# Patient Record
Sex: Female | Born: 1980 | Race: White | Hispanic: No | Marital: Married | Smoking: Current every day smoker
Health system: Southern US, Community
[De-identification: ages and names within clinical notes are randomized; demographics above are authoritative.]

## PROBLEM LIST (undated history)

## (undated) DIAGNOSIS — N760 Acute vaginitis: Principal | ICD-10-CM

## (undated) DIAGNOSIS — Z8669 Personal history of other diseases of the nervous system and sense organs: Secondary | ICD-10-CM

## (undated) DIAGNOSIS — B9689 Other specified bacterial agents as the cause of diseases classified elsewhere: Secondary | ICD-10-CM

## (undated) HISTORY — DX: Other specified bacterial agents as the cause of diseases classified elsewhere: N76.0

## (undated) HISTORY — DX: Other specified bacterial agents as the cause of diseases classified elsewhere: B96.89

## (undated) HISTORY — DX: Personal history of other diseases of the nervous system and sense organs: Z86.69

---

## 2012-01-17 ENCOUNTER — Encounter: Payer: Self-pay | Admitting: Obstetrics and Gynecology

## 2012-01-24 ENCOUNTER — Ambulatory Visit (INDEPENDENT_AMBULATORY_CARE_PROVIDER_SITE_OTHER): Payer: Self-pay | Admitting: Obstetrics and Gynecology

## 2012-01-24 ENCOUNTER — Encounter: Payer: Self-pay | Admitting: Obstetrics and Gynecology

## 2012-01-24 VITALS — BP 92/62 | HR 68 | Ht 68.0 in | Wt 148.0 lb

## 2012-01-24 DIAGNOSIS — A499 Bacterial infection, unspecified: Secondary | ICD-10-CM

## 2012-01-24 DIAGNOSIS — Z202 Contact with and (suspected) exposure to infections with a predominantly sexual mode of transmission: Secondary | ICD-10-CM

## 2012-01-24 DIAGNOSIS — N760 Acute vaginitis: Secondary | ICD-10-CM

## 2012-01-24 DIAGNOSIS — B9689 Other specified bacterial agents as the cause of diseases classified elsewhere: Secondary | ICD-10-CM

## 2012-01-24 DIAGNOSIS — Z9189 Other specified personal risk factors, not elsewhere classified: Secondary | ICD-10-CM

## 2012-01-24 LAB — POCT WET PREP (WET MOUNT): Clue Cells Wet Prep Whiff POC: POSITIVE

## 2012-01-24 MED ORDER — METRONIDAZOLE 500 MG PO TABS: 500.0000 mg | ORAL_TABLET | Freq: Two times a day (BID) | ORAL | Status: AC

## 2012-01-24 NOTE — Progress Notes (Signed)
31 YO presents with  the need for std testing because she says that her husband has been unfaithful. Denies pelvic pain, uti symptoms or vaginal itching.  She does admit to increased vaginal discharge.   O: Pelvic:  EGBUS-wnl, vagina- large grey discharge, cervix-posterior/displaced right, uterus- normal size/shape    Wet Prep: pH 5.9, whiff +,  clue +   A: Bacterial Vagnosis  P: Perineal hygiene      GC/Ct Pending      Metronidazole 500 mg #14 bid x 7 days; Etoh precautions  Narada Uzzle, PA-C

## 2012-01-24 NOTE — Patient Instructions (Signed)
Avoid: - excess soap on genital area (consider using plain oatmeal soap) - use of powder or sprays in genital area - douching - wearing underwear to bed (except with menses) - using more than is directed detergent when washing clothes - tight fitting garments around genital area - excess sugar intake   

## 2012-01-25 LAB — GC/CHLAMYDIA PROBE AMP, GENITAL: Chlamydia, DNA Probe: NEGATIVE

## 2012-10-20 ENCOUNTER — Ambulatory Visit: Payer: Self-pay | Admitting: Obstetrics and Gynecology

## 2012-10-20 ENCOUNTER — Encounter: Payer: Self-pay | Admitting: Obstetrics and Gynecology

## 2012-10-20 VITALS — BP 100/62 | HR 70 | Ht 68.0 in | Wt 148.0 lb

## 2012-10-20 DIAGNOSIS — Z124 Encounter for screening for malignant neoplasm of cervix: Secondary | ICD-10-CM

## 2012-10-20 DIAGNOSIS — N390 Urinary tract infection, site not specified: Secondary | ICD-10-CM

## 2012-10-20 DIAGNOSIS — Z01419 Encounter for gynecological examination (general) (routine) without abnormal findings: Secondary | ICD-10-CM

## 2012-10-20 LAB — POCT URINALYSIS DIPSTICK
Blood, UA: NEGATIVE
Ketones, UA: NEGATIVE
Protein, UA: NEGATIVE
Spec Grav, UA: >=1.03

## 2012-10-20 NOTE — Progress Notes (Signed)
Subjective:    Dawn Herman is a 32 y.o. female, G1P0010, who presents for an annual exam. The patient reports urinary frequency and wants to be tested for GC/CT. Reports a coworker had to be hospitalized for a kidney infection and she's concerned that the same may happen to her.  Patient desires pregnancy but states that prenatal vitamins caused her throat to swell and has never taken regular multivitamins.  Reviewed the need for folic acid.  Menstrual cycle:   LMP: Patient's last menstrual period was 10/10/2012.             Review of Systems Pertinent items are noted in HPI. Denies pelvic pain, urinary tract symptoms, vaginitis symptoms, irregular bleeding, menopausal symptoms, change in bowel habits or rectal bleeding   Objective:    BP 100/62  Pulse 70  Ht 5\' 8"  (1.727 m)  Wt 148 lb (67.132 kg)  BMI 22.51 kg/m2  LMP 10/10/2012    Wt Readings from Last 1 Encounters:  10/20/12 148 lb (67.132 kg)   Body mass index is 22.51 kg/(m^2). General Appearance: Alert, no acute distress HEENT: Grossly normal Neck / Thyroid: Supple, no thyromegaly or cervical adenopathy Lungs: Clear to auscultation bilaterally Back: No CVA tenderness Breast Exam: No masses or nodes.No dimpling, nipple retraction or discharge. Cardiovascular: Regular rate and rhythm.  Gastrointestinal: Soft, non-tender, no masses or organomegaly Pelvic Exam: EGBUS-wnl, vagina-normal rugae, cervix- without lesions or tenderness, uterus appears normal size shape and consistency, adnexae-no masses or tenderness Lymphatic Exam: Non-palpable nodes in neck, clavicular,  axillary, or inguinal regions  Skin: no rashes or abnormalities Extremities: no clubbing cyanosis or edema  Neurologic: grossly normal Psychiatric: Alert and oriented  Urinalysis: SG: 1.030,  ph: 5.0 otherwise negative.   Assessment:   Routine GYN Exam Desire to Conceive (intolerant of prenatal vitamins)   Plan:    PAP sent  RTO 1 year or  prn  Kristell Wooding,ELMIRAPA-C

## 2012-10-20 NOTE — Progress Notes (Signed)
Regular Periods: yes Mammogram: no  Monthly Breast Ex.: yes Exercise: yes  Tetanus < 10 years: no Seatbelts: yes  NI. Bladder Functn.: yes Abuse at home: no  Daily BM's: no Stressful Work: yes  Healthy Diet: yes Sigmoid-Colonoscopy: no  Calcium: no Medical problems this year: want to check for uti   LAST PAP:6/11  Contraception: none  Mammogram:  no  PCP: no  PMH: no change  FMH: no change  Last Bone Scan: no  Pt is married.

## 2012-10-20 NOTE — Patient Instructions (Signed)
Take Folic Acid 400 mcg daily or more as you prepare to get pregnant

## 2012-10-24 LAB — PAP IG, CT-NG, RFX HPV ASCU
Chlamydia Probe Amp: NEGATIVE
GC Probe Amp: NEGATIVE

## 2012-10-30 ENCOUNTER — Telehealth: Payer: Self-pay | Admitting: Obstetrics and Gynecology

## 2012-10-30 NOTE — Telephone Encounter (Signed)
Spoke with pt informed labs wnl pt voice understanding 

## 2013-07-21 ENCOUNTER — Ambulatory Visit: Payer: 59 | Admitting: Emergency Medicine

## 2013-07-21 ENCOUNTER — Ambulatory Visit: Payer: 59

## 2013-07-21 VITALS — BP 110/60 | HR 92 | Temp 101.0°F | Resp 18 | Ht 68.0 in | Wt 143.6 lb

## 2013-07-21 DIAGNOSIS — R059 Cough, unspecified: Secondary | ICD-10-CM

## 2013-07-21 DIAGNOSIS — R509 Fever, unspecified: Secondary | ICD-10-CM

## 2013-07-21 DIAGNOSIS — R05 Cough: Secondary | ICD-10-CM

## 2013-07-21 DIAGNOSIS — J029 Acute pharyngitis, unspecified: Secondary | ICD-10-CM

## 2013-07-21 LAB — POCT CBC
Granulocyte percent: 83.9 %G — AB (ref 37–80)
HCT, POC: 44.8 % (ref 37.7–47.9)
Hemoglobin: 14.1 g/dL (ref 12.2–16.2)
Lymph, poc: 1.3 (ref 0.6–3.4)
MCH, POC: 30.4 pg (ref 27–31.2)
MCHC: 31.5 g/dL — AB (ref 31.8–35.4)
MCV: 96.5 fL (ref 80–97)
MID (cbc): 0.6 (ref 0–0.9)
MPV: 9.3 fL (ref 0–99.8)
POC Granulocyte: 9.8 — AB (ref 2–6.9)
POC LYMPH PERCENT: 11.3 %L (ref 10–50)
POC MID %: 4.8 %M (ref 0–12)
Platelet Count, POC: 170 10*3/uL (ref 142–424)
RBC: 4.64 M/uL (ref 4.04–5.48)
RDW, POC: 13.1 %
WBC: 11.7 10*3/uL — AB (ref 4.6–10.2)

## 2013-07-21 LAB — POCT RAPID STREP A (OFFICE): Rapid Strep A Screen: NEGATIVE

## 2013-07-21 LAB — POCT INFLUENZA A/B
Influenza A, POC: NEGATIVE
Influenza B, POC: NEGATIVE

## 2013-07-21 MED ORDER — AMOXICILLIN 875 MG PO TABS: 875.0000 mg | ORAL_TABLET | Freq: Two times a day (BID) | ORAL | Status: DC

## 2013-07-21 NOTE — Progress Notes (Signed)
Patient ID: Dawn Herman MRN: [id number], DOB: 1981/01/25, 32 y.o. Date of Encounter: 07/21/2013, 5:28 PM  Primary Physician: Henreitta Leber, PA-C  Chief Complaint: fever, chills, nasal congestion  HPI: 32 y.o. year old female with presents with 1 day history of nasal congestion, fever, chills, and slight dry cough. Complains of chest pain while coughing and also complains of severe sore throat. Does have hx of strep throat. No known flu or strep contacts but she works at [employer] so has a lot of exposure to [employer]. Denies SOB, nausea, vomiting, or dizziness. Has taken ibuprofen which has helped some with the fever. Highest temp at home 102.  Symptoms started suddenly this morning.    Patient is otherwise doing well without issues or complaints.  Past Medical History  Diagnosis Date  . BV (bacterial vaginosis)     recurrent  . Hx of migraines      Home Meds: Prior to Admission medications   Not on File    Allergies:  Allergies  Allergen Reactions  . Tylenol [Acetaminophen] Other (See Comments)    migraines    History   Social History  . Marital Status: Married    Spouse Name: N/A    Number of Children: N/A  . Years of Education: N/A   Occupational History  . Not on file.   Social History Main Topics  . Smoking status: Current Every Day Smoker -- 15.00 packs/day    Types: Cigarettes  . Smokeless tobacco: Never Used     Comment: 15 cigarettes a day  . Alcohol Use: Yes     Comment: social  . Drug Use: Yes  . Sexual Activity: Yes    Partners: Male    Birth Control/ Protection: None   Other Topics Concern  . Not on file   Social History Narrative  . No narrative on file     Review of Systems: Constitutional: positive for chills and fever. Negative for night sweats, weight changes, or fatigue  HEENT: negative for vision changes, hearing loss. Positive for congestion, rhinorrhea, ST. No epistaxis, or sinus pressure Cardiovascular: negative for chest  pain or palpitations Respiratory: positive for cough. negative for hemoptysis, wheezing, shortness of breath. Abdominal: negative for abdominal pain, nausea, vomiting, diarrhea, or constipation Dermatological: negative for rashes. Neurologic: negative for headache, dizziness, or syncope   Physical Exam: Blood pressure 110/60, pulse 92, temperature 101 F (38.3 C), temperature source Oral, resp. rate 18, height 5\' 8"  (1.727 m), weight 143 lb 9.6 oz (65.137 kg), last menstrual period 07/13/2013, SpO2 98.00%., Body mass index is 21.84 kg/(m^2). General: Well developed, well nourished, in no acute distress. Head: Normocephalic, atraumatic, eyes without discharge, sclera non-icteric, nares are without discharge. Bilateral auditory canals clear, TM's are without perforation, pearly grey and translucent with reflective cone of light bilaterally. Oral cavity moist, posterior pharynx + exudate and erythema. No peritonsillar abscess, or post nasal drip.  Neck: Supple. No thyromegaly. Full ROM. No lymphadenopathy. Lungs: Clear bilaterally to auscultation without wheezes, rales, or rhonchi. Breathing is unlabored. Heart: RRR with S1 S2. No murmurs, rubs, or gallops appreciated. Msk:  Strength and tone normal for age. Extremities/Skin: Warm and dry. No clubbing or cyanosis. No edema.  Neuro: Alert and oriented X 3. Moves all extremities spontaneously. Gait is normal. CNII-XII grossly in tact. Psych:  Responds to questions appropriately with a normal affect.   Labs: Results for orders placed in visit on 07/21/13  POCT INFLUENZA A/B      Result Value Range  Influenza A, POC Negative     Influenza B, POC Negative    POCT RAPID STREP A (OFFICE)      Result Value Range   Rapid Strep A Screen Negative  Negative  POCT CBC      Result Value Range   WBC 11.7 (*) 4.6 - 10.2 K/uL   Lymph, poc 1.3  0.6 - 3.4   POC LYMPH PERCENT 11.3  10 - 50 %L   MID (cbc) 0.6  0 - 0.9   POC MID % 4.8  0 - 12 %M   POC  Granulocyte 9.8 (*) 2 - 6.9   Granulocyte percent 83.9 (*) 37 - 80 %G   RBC 4.64  4.04 - 5.48 M/uL   Hemoglobin 14.1  12.2 - 16.2 g/dL   HCT, POC 16.1  09.6 - 47.9 %   MCV 96.5  80 - 97 fL   MCH, POC 30.4  27 - 31.2 pg   MCHC 31.5 (*) 31.8 - 35.4 g/dL   RDW, POC 04.5     Platelet Count, POC 170  142 - 424 K/uL   MPV 9.3  0 - 99.8 fL       [facility] reading (PRIMARY) by  Dr. Cleta Alberts as no acute infiltrate or consolidation.    ASSESSMENT AND PLAN:  32 y.o. year old female with influenza-like illness and possible strep tonsillitis.  Increase fluids and rest Ibuprofen and tylenol in alternating doses q4hours Start amoxicillin 875 mg bid x 10 days. Discussed starting Tamiflu pros/cons - patient decided to hold on this Follow up if symptoms worsen or fail to improve.  Dawn Mitts, PA-C 07/21/2013 5:28 PM

## 2013-07-23 LAB — CULTURE, GROUP A STREP

## 2014-03-19 IMAGING — CR DG CHEST 2V
2 series · 2 of 2 positions shown · non-contrast
Comparison: None available for comparison at time of study
interpretation.

CLINICAL DATA: Cough.

EXAM:
CHEST  2 VIEW

[PA]
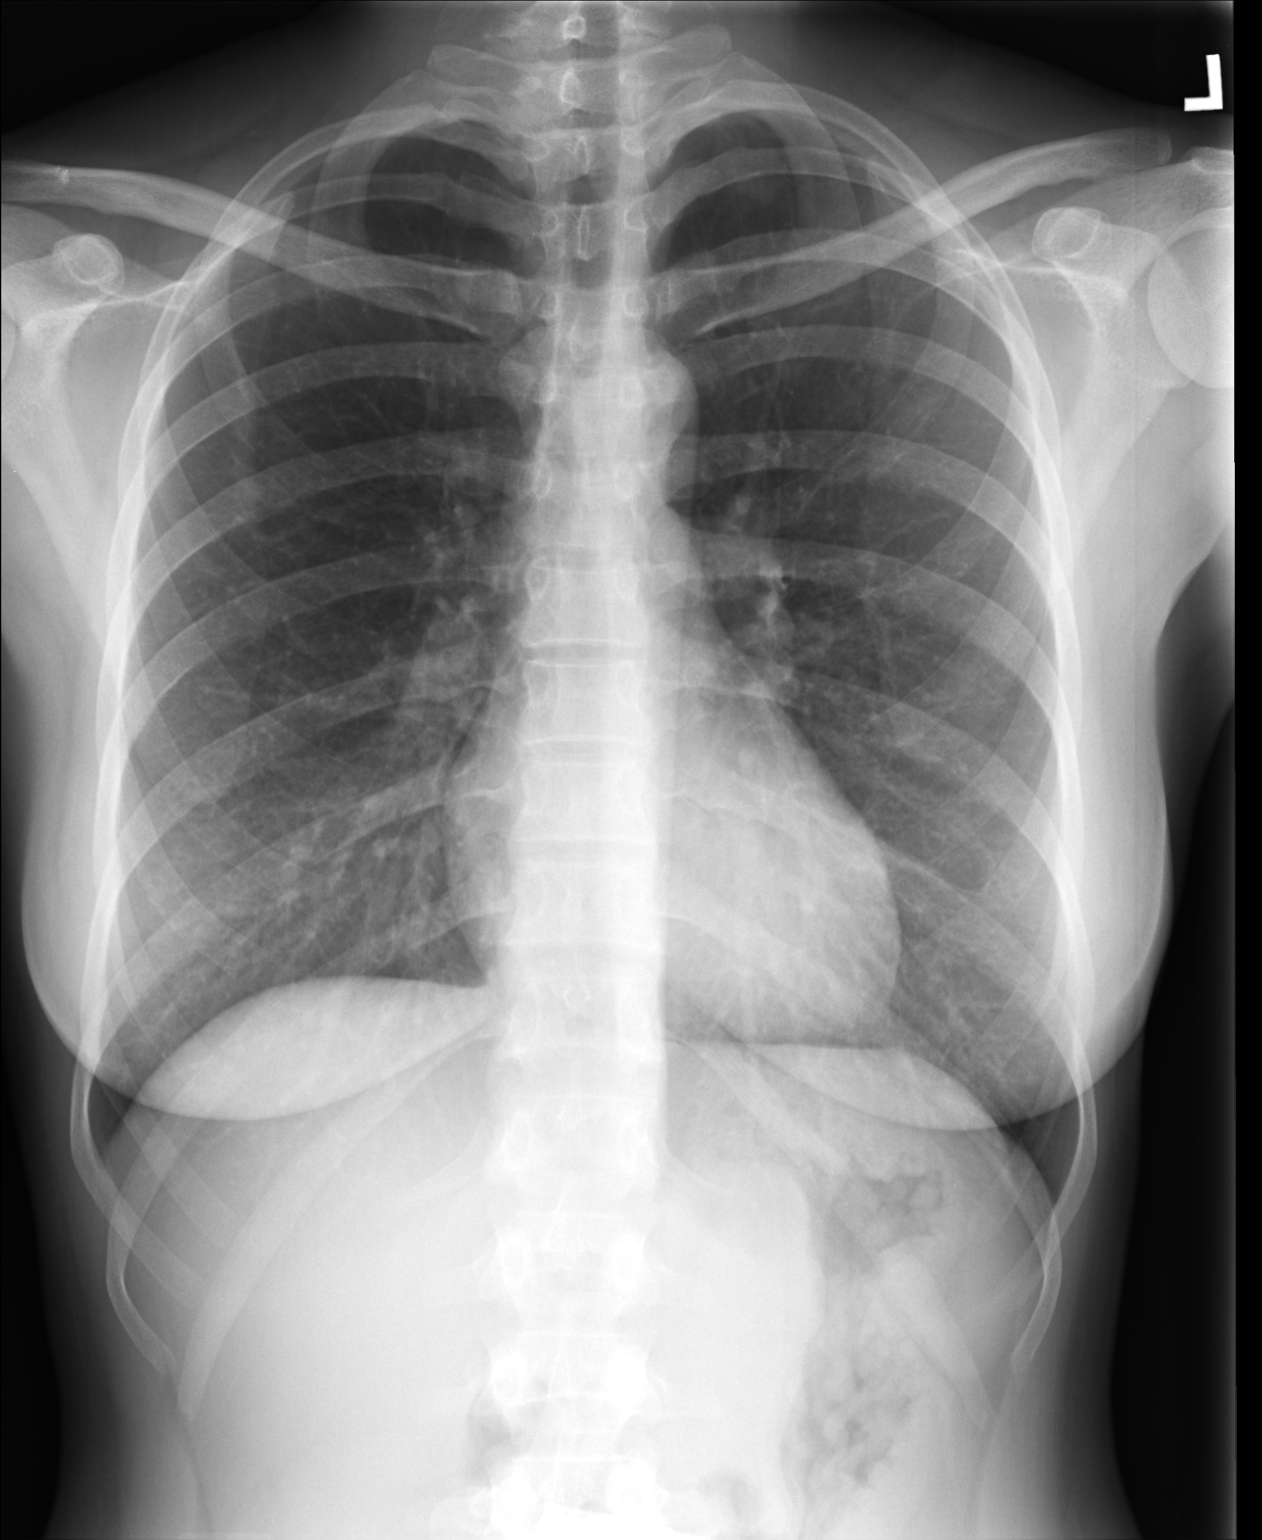

[lateral]
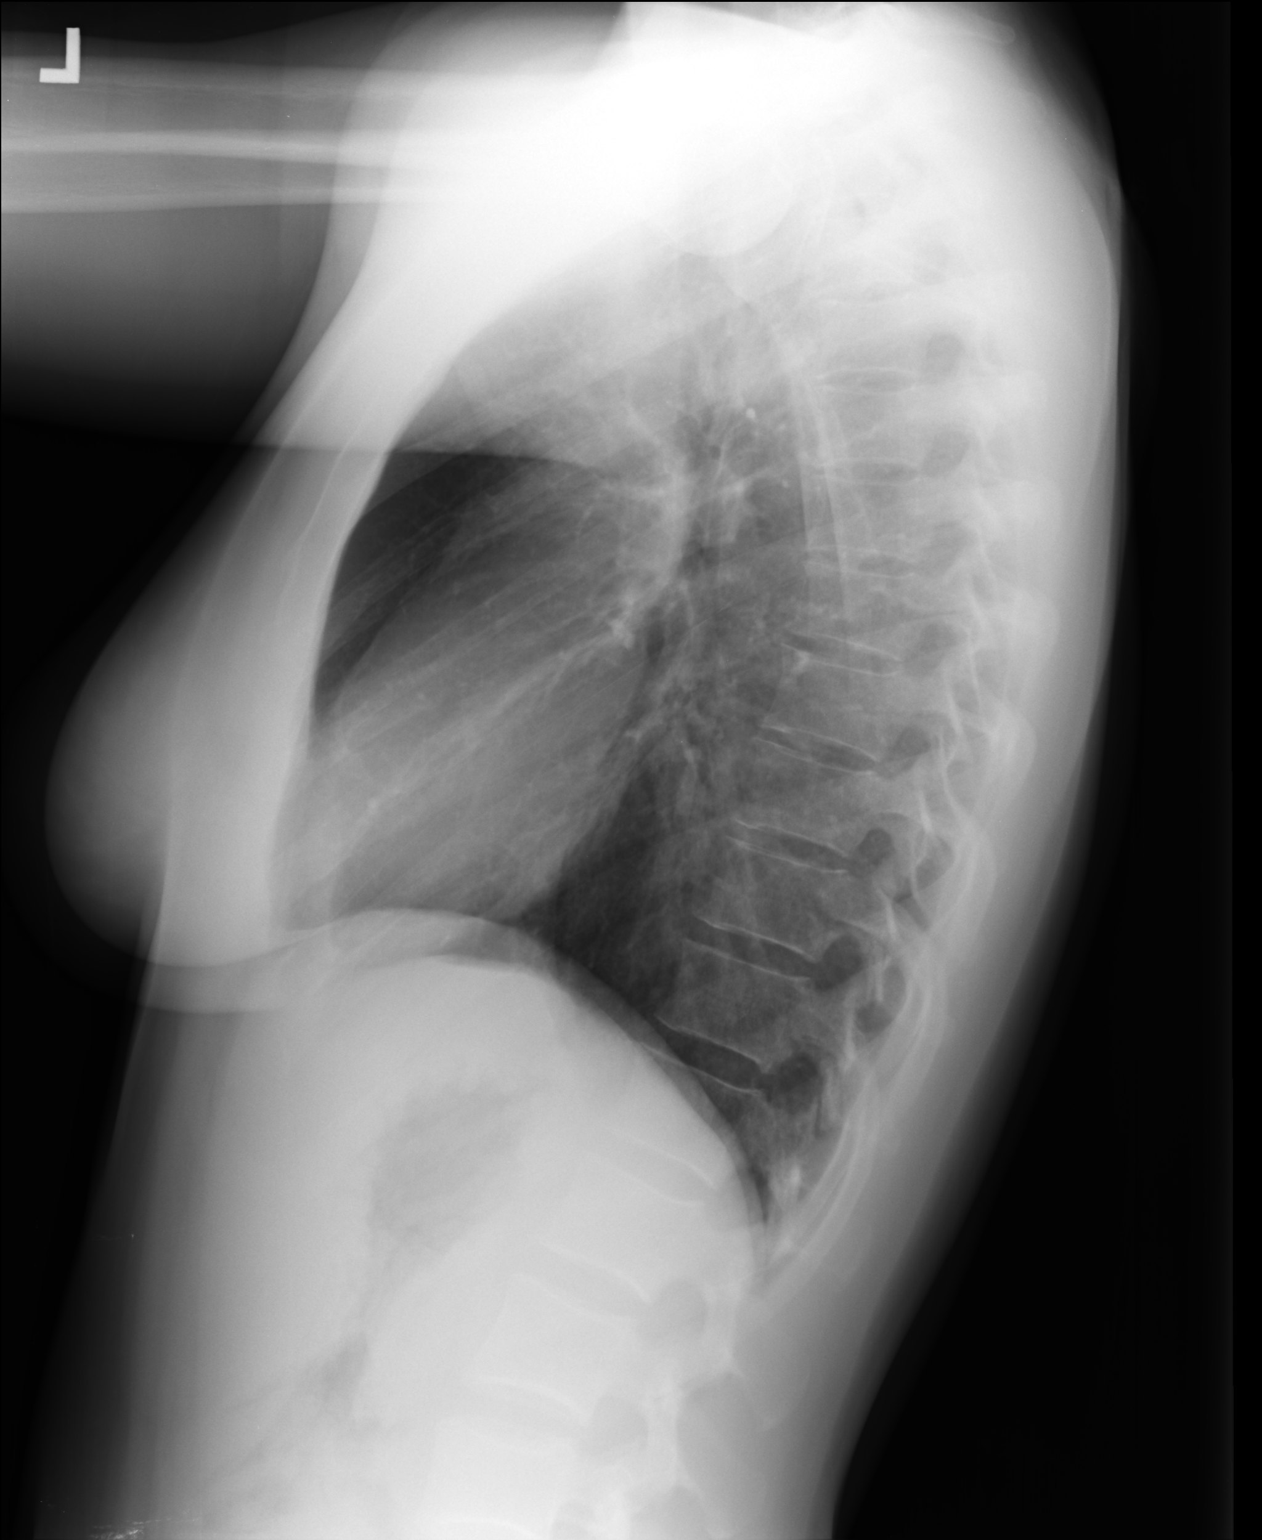

[2 of 2 positions shown; findings below may reference images not displayed]

FINDINGS: Cardiomediastinal silhouette is unremarkable. The lungs are clear
without pleural effusions or focal consolidations. Pulmonary
vasculature is unremarkable. Trachea projects midline and there is
no pneumothorax. Soft tissue planes and included osseous structures
are nonsuspicious.
IMPRESSION: No acute cardiopulmonary process.

  By: Thaibaipo Jamisola

## 2014-07-18 ENCOUNTER — Ambulatory Visit (INDEPENDENT_AMBULATORY_CARE_PROVIDER_SITE_OTHER): Payer: 59 | Admitting: Family Medicine

## 2014-07-18 VITALS — BP 102/68 | HR 78 | Temp 98.2°F | Resp 18 | Ht 69.0 in | Wt 128.0 lb

## 2014-07-18 DIAGNOSIS — J029 Acute pharyngitis, unspecified: Secondary | ICD-10-CM

## 2014-07-18 DIAGNOSIS — R509 Fever, unspecified: Secondary | ICD-10-CM

## 2014-07-18 LAB — POCT CBC
GRANULOCYTE PERCENT: 67.7 % (ref 37–80)
HCT, POC: 47 % (ref 37.7–47.9)
Hemoglobin: 15.5 g/dL (ref 12.2–16.2)
Lymph, poc: 2.6 (ref 0.6–3.4)
MCH: 30.8 pg (ref 27–31.2)
MCHC: 32.9 g/dL (ref 31.8–35.4)
MCV: 93.7 fL (ref 80–97)
MID (CBC): 0.5 (ref 0–0.9)
MPV: 7.9 fL (ref 0–99.8)
PLATELET COUNT, POC: 176 10*3/uL (ref 142–424)
POC Granulocyte: 6.5 (ref 2–6.9)
POC LYMPH %: 27.4 % (ref 10–50)
POC MID %: 4.9 %M (ref 0–12)
RBC: 5.01 M/uL (ref 4.04–5.48)
RDW, POC: 13.5 %
WBC: 9.6 10*3/uL (ref 4.6–10.2)

## 2014-07-18 LAB — POCT RAPID STREP A (OFFICE): Rapid Strep A Screen: NEGATIVE

## 2014-07-18 MED ORDER — AMOXICILLIN-POT CLAVULANATE 875-125 MG PO TABS: 1.0000 | ORAL_TABLET | Freq: Two times a day (BID) | ORAL | Status: DC

## 2014-07-18 NOTE — Progress Notes (Addendum)
[facility] [address], [place] [phone]  Date:  07/18/2014   Name:  Dawn ArntVeronica Yi   DOB:  Jun 29, 1981   MRN:  [id number]  PCP:  Patrick JupiterPOWELL,ELMIRA, PA-C    Chief Complaint: Fever; Night Sweats; Generalized Body Aches; and Sore Throat   History of Present Illness:  Dawn Herman is a 33 y.o. very pleasant female patient who presents with the following:  Here today with illnes for 2-3 days.  She was here last year around this time with strep throat.  She notes a ST, fever up to 102, tender LAD in her neck, fatigue and body aches.  No cough, no GI symptoms.  No runny nose or sneezing, no earache.  The ST is her main concern today. She is generally in good health.    LMP 10/28  Patient Active Problem List   Diagnosis Date Noted  . BV (bacterial vaginosis) 01/24/2012    Past Medical History  Diagnosis Date  . BV (bacterial vaginosis)     recurrent  . Hx of migraines     History reviewed. No pertinent past surgical history.  History  Substance Use Topics  . Smoking status: Current Every Day Smoker -- 15.00 packs/day    Types: Cigarettes  . Smokeless tobacco: Never Used     Comment: 15 cigarettes a day  . Alcohol Use: Yes     Comment: social    Family History  Problem Relation Age of Onset  . Hypertension Mother     Allergies  Allergen Reactions  . Tylenol [Acetaminophen] Other (See Comments)    migraines    Medication list has been reviewed and updated.  Current Outpatient Prescriptions on File Prior to Visit  Medication Sig Dispense Refill  . amoxicillin (AMOXIL) 875 MG tablet Take 1 tablet (875 mg total) by mouth 2 (two) times daily. 20 tablet 0   No current facility-administered medications on file prior to visit.    Review of Systems:  As per HPI- otherwise negative.   Physical Examination: Filed Vitals:   07/18/14 1343  BP: 102/68  Pulse: 78  Temp: 98.2 F (36.8 C)  Resp: 18   Filed Vitals:   07/18/14 1343  Height: 5\' 9"  (1.753 m)  Weight: 128 lb (58.06 kg)   Body mass index is 18.89 kg/(m^2). Ideal Body Weight: Weight in (lb) to have BMI = 25: 168.9  GEN: WDWN, NAD, Non-toxic, A & O x 3, slim, looks well HEENT: Atraumatic, Normocephalic. Neck supple. No masses, tender cervical LAD.  Bilateral TM wnl, oropharynx inflmaed with slight exudate. PEERL,EOMI.   Ears and Nose: No external deformity. CV: RRR, No M/G/R. No JVD. No thrill. No extra heart sounds. PULM: CTA B, no wheezes, crackles, rhonchi. No retractions. No resp. distress. No accessory muscle use. ABD: S, NT, ND EXTR: No c/c/e NEURO Normal gait.  PSYCH: Normally interactive. Conversant. Not depressed or anxious appearing.  Calm demeanor.    Results for orders placed or performed in visit on 07/18/14  POCT rapid strep A  Result Value Ref Range   Rapid Strep A Screen Negative Negative  POCT CBC  Result Value Ref Range   WBC 9.6 4.6 - 10.2 K/uL   Lymph, poc 2.6 0.6 - 3.4   POC LYMPH PERCENT 27.4 10 - 50 %L   MID (cbc) 0.5 0 - 0.9   POC MID % 4.9 0 - 12 %M   POC Granulocyte 6.5 2 - 6.9   Granulocyte percent  67.7 37 - 80 %G   RBC 5.01 4.04 - 5.48 M/uL   Hemoglobin 15.5 12.2 - 16.2 g/dL   HCT, POC 16.147.0 09.637.7 - 47.9 %   MCV 93.7 80 - 97 fL   MCH, POC 30.8 27 - 31.2 pg   MCHC 32.9 31.8 - 35.4 g/dL   RDW, POC 04.513.5 %   Platelet Count, POC 176 142 - 424 K/uL   MPV 7.9 0 - 99.8 fL    Assessment and Plan: Fever, unknown origin - Plan: POCT rapid strep A, Culture, Group A Strep, POCT CBC, amoxicillin-clavulanate (AUGMENTIN) 875-125 MG per tablet  Acute pharyngitis, unspecified pharyngitis type - Plan: POCT rapid strep A, Culture, Group A Strep, POCT CBC, amoxicillin-clavulanate (AUGMENTIN) 875-125 MG per tablet  Exudative pharyngitis with negative rapid strep.  Consider strep throat vs other bacterial infection.  Start tx with agumentin 875 BID for 10 days and await culture  Signed Abbe AmsterdamJessica Copland, MD  11/15:  received her negative tcx.  Called but no answer and no VM> will send letter

## 2014-07-18 NOTE — Patient Instructions (Signed)
Use the augmentin antibiotic as directed, and use ibuprofen as needed for fever. Rest and drink plenty of fluids Let me know if you are not better soon!

## 2014-07-21 ENCOUNTER — Encounter: Payer: Self-pay | Admitting: Family Medicine

## 2014-07-21 LAB — CULTURE, GROUP A STREP: Organism ID, Bacteria: NORMAL

## 2018-02-14 ENCOUNTER — Encounter (HOSPITAL_COMMUNITY): Payer: Self-pay | Admitting: *Deleted

## 2018-02-14 ENCOUNTER — Other Ambulatory Visit: Payer: Self-pay

## 2018-02-14 DIAGNOSIS — R51 Headache: Secondary | ICD-10-CM | POA: Insufficient documentation

## 2018-02-14 DIAGNOSIS — Z5321 Procedure and treatment not carried out due to patient leaving prior to being seen by health care provider: Secondary | ICD-10-CM | POA: Insufficient documentation

## 2018-02-14 NOTE — ED Triage Notes (Addendum)
Pt reports migraine h/a today.  Reports nausea resolved after taking 200mg  of motrin.  She states she received an injection for her migraine h/a about 5 years ago and has not had any h/a since.  Pt is A&Ox 4.  Ambulatory without difficulty.  Reports photosensitivity.

## 2018-02-15 ENCOUNTER — Emergency Department (HOSPITAL_COMMUNITY)
Admission: EM | Admit: 2018-02-15 | Discharge: 2018-02-15 | Disposition: A | Discharge status: Home / Self Care | Payer: BLUE CROSS/BLUE SHIELD | Attending: Emergency Medicine | Admitting: Emergency Medicine

## 2018-02-15 NOTE — ED Notes (Signed)
Follow up call made  No answer  02/15/18 1257 s Antony Sian rn

## 2018-02-15 NOTE — ED Notes (Signed)
Pt no longer in lobby.  

## 2018-05-03 DIAGNOSIS — M545 Low back pain: Secondary | ICD-10-CM | POA: Diagnosis not present

## 2018-05-03 DIAGNOSIS — R300 Dysuria: Secondary | ICD-10-CM | POA: Diagnosis not present

## 2018-06-20 DIAGNOSIS — M549 Dorsalgia, unspecified: Secondary | ICD-10-CM | POA: Diagnosis not present

## 2018-06-20 DIAGNOSIS — F4320 Adjustment disorder, unspecified: Secondary | ICD-10-CM | POA: Diagnosis not present

## 2019-01-31 DIAGNOSIS — R399 Unspecified symptoms and signs involving the genitourinary system: Secondary | ICD-10-CM | POA: Diagnosis not present

## 2019-08-10 DIAGNOSIS — R399 Unspecified symptoms and signs involving the genitourinary system: Secondary | ICD-10-CM | POA: Diagnosis not present

## 2019-08-10 DIAGNOSIS — Z113 Encounter for screening for infections with a predominantly sexual mode of transmission: Secondary | ICD-10-CM | POA: Diagnosis not present

## 2019-08-10 DIAGNOSIS — N926 Irregular menstruation, unspecified: Secondary | ICD-10-CM | POA: Diagnosis not present

## 2019-08-10 DIAGNOSIS — N946 Dysmenorrhea, unspecified: Secondary | ICD-10-CM | POA: Diagnosis not present

## 2022-01-25 DIAGNOSIS — R002 Palpitations: Secondary | ICD-10-CM | POA: Diagnosis not present

## 2022-01-25 DIAGNOSIS — R519 Headache, unspecified: Secondary | ICD-10-CM | POA: Diagnosis not present

## 2022-01-25 DIAGNOSIS — R1031 Right lower quadrant pain: Secondary | ICD-10-CM | POA: Diagnosis not present

## 2022-01-25 DIAGNOSIS — R079 Chest pain, unspecified: Secondary | ICD-10-CM | POA: Diagnosis not present

## 2022-05-26 DIAGNOSIS — J019 Acute sinusitis, unspecified: Secondary | ICD-10-CM | POA: Diagnosis not present

## 2022-05-26 DIAGNOSIS — R519 Headache, unspecified: Secondary | ICD-10-CM | POA: Diagnosis not present

## 2022-06-23 DIAGNOSIS — Z01419 Encounter for gynecological examination (general) (routine) without abnormal findings: Secondary | ICD-10-CM | POA: Diagnosis not present

## 2022-06-23 DIAGNOSIS — Z6820 Body mass index (BMI) 20.0-20.9, adult: Secondary | ICD-10-CM | POA: Diagnosis not present

## 2022-06-23 DIAGNOSIS — Z124 Encounter for screening for malignant neoplasm of cervix: Secondary | ICD-10-CM | POA: Diagnosis not present

## 2022-06-23 DIAGNOSIS — Z1239 Encounter for other screening for malignant neoplasm of breast: Secondary | ICD-10-CM | POA: Diagnosis not present

## 2022-06-23 DIAGNOSIS — Z87440 Personal history of urinary (tract) infections: Secondary | ICD-10-CM | POA: Diagnosis not present

## 2022-06-23 LAB — RESULTS CONSOLE HPV: CHL HPV: NEGATIVE

## 2022-06-23 LAB — HM PAP SMEAR: HM Pap smear: NORMAL

## 2022-08-06 DIAGNOSIS — J0100 Acute maxillary sinusitis, unspecified: Secondary | ICD-10-CM | POA: Diagnosis not present

## 2022-08-08 ENCOUNTER — Emergency Department (HOSPITAL_COMMUNITY): Payer: BC Managed Care – PPO

## 2022-08-08 ENCOUNTER — Emergency Department (HOSPITAL_COMMUNITY)
Admission: EM | Admit: 2022-08-08 | Discharge: 2022-08-09 | Disposition: A | Discharge status: Home / Self Care | Payer: BC Managed Care – PPO | Attending: Emergency Medicine | Admitting: Emergency Medicine

## 2022-08-08 DIAGNOSIS — Z23 Encounter for immunization: Secondary | ICD-10-CM | POA: Diagnosis not present

## 2022-08-08 DIAGNOSIS — S61412A Laceration without foreign body of left hand, initial encounter: Secondary | ICD-10-CM | POA: Diagnosis not present

## 2022-08-08 DIAGNOSIS — Y92000 Kitchen of unspecified non-institutional (private) residence as  the place of occurrence of the external cause: Secondary | ICD-10-CM | POA: Diagnosis not present

## 2022-08-08 DIAGNOSIS — W260XXA Contact with knife, initial encounter: Secondary | ICD-10-CM | POA: Diagnosis not present

## 2022-08-08 DIAGNOSIS — S6992XA Unspecified injury of left wrist, hand and finger(s), initial encounter: Secondary | ICD-10-CM | POA: Diagnosis not present

## 2022-08-08 NOTE — ED Triage Notes (Signed)
Patient arrived with complaints of laceration to her left hand with a clean knife at home. Bleeding controlled.

## 2022-08-09 MED ORDER — LIDOCAINE HCL (PF) 1 % IJ SOLN
5.0000 mL | Freq: Once | INTRAMUSCULAR | Status: DC
  Filled 2022-08-09: qty 30

## 2022-08-09 MED ORDER — TETANUS-DIPHTH-ACELL PERTUSSIS 5-2.5-18.5 LF-MCG/0.5 IM SUSY
0.5000 mL | PREFILLED_SYRINGE | Freq: Once | INTRAMUSCULAR | Status: AC
  Administered 2022-08-09: 0.5 mL via INTRAMUSCULAR
  Filled 2022-08-09: qty 0.5

## 2022-08-09 MED ORDER — CEPHALEXIN 500 MG PO CAPS: 500.0000 mg | ORAL_CAPSULE | Freq: Three times a day (TID) | ORAL | 0 refills | Status: AC

## 2022-08-09 NOTE — ED Notes (Signed)
Declined sutures being placed. Request derma bond.

## 2022-08-09 NOTE — ED Provider Notes (Signed)
[facility]-EMERGENCY DEPT Provider Note   CSN: [id number] Arrival date & time: 08/08/22  1854     History {Add pertinent medical, surgical, social history, OB history to HPI:1} Chief Complaint  Patient presents with   Laceration    Shalicia Craghead is a 41 y.o. female.  41 year old female presents today with her significant other for evaluation of lacerations to her aspect of her left hand that occurred around 6 PM.  Patient was using a kitchen knife when this happened.  Denies any other injury.  Not on anticoagulation.  Bleeding controlled.  The history is provided by the patient. No language interpreter was used.       Home Medications Prior to Admission medications   Medication Sig Start Date End Date Taking? Authorizing Provider  amoxicillin-clavulanate (AUGMENTIN) 875-125 MG per tablet Take 1 tablet by mouth 2 (two) times daily. 07/18/14   Copland, Gwenlyn Found, MD      Allergies    Tylenol [acetaminophen]    Review of Systems   Review of Systems  Skin:  Positive for wound.  All other systems reviewed and are negative.   Physical Exam Updated Vital Signs BP 133/89 (BP Location: Right Arm)   Pulse 86   Temp 98.5 F (36.9 C) (Oral)   Resp 16   Ht 5\' 9"  (1.753 m)   Wt 65.8 kg   LMP 08/08/2022   SpO2 100%   BMI 21.41 kg/m  Physical Exam Vitals and nursing note reviewed.  Constitutional:      General: She is not in acute distress.    Appearance: Normal appearance. She is not ill-appearing.  HENT:     Head: Normocephalic and atraumatic.     Nose: Nose normal.  Eyes:     Conjunctiva/sclera: Conjunctivae normal.  Pulmonary:     Effort: Pulmonary effort is normal. No respiratory distress.  Musculoskeletal:        General: No deformity.  Skin:    Findings: No rash.     Comments: 1 cm laceration to left the lower aspect noted.  No active bleeding.  Fairly superficial.  Neurological:     Mental Status: She is alert.     ED Results  / Procedures / Treatments   Labs (all labs ordered are listed, but only abnormal results are displayed) Labs Reviewed - No data to display  EKG None  Radiology DG Hand Complete Left  Result Date: 08/08/2022 CLINICAL DATA:  Laceration to the left hand. EXAM: LEFT HAND - COMPLETE 3+ VIEW COMPARISON:  None Available. FINDINGS: There is no evidence of fracture or dislocation. There is no evidence of arthropathy or other focal bone abnormality. Soft tissues are unremarkable. The laceration is not radiographically visible. IMPRESSION: No acute fracture or dislocation. The laceration is not radiographically visible. No radiopaque foreign body is seen. Electronically Signed   By: 14/11/2021 M.D.   On: 08/08/2022 23:11    Procedures .14/03/2023Laceration Repair  Date/Time: 08/09/2022 12:39 AM  Performed by: 14/12/2021, PA-C Authorized by: Marita Kansas, PA-C   Consent:    Consent obtained:  Verbal   Consent given by:  Patient   Risks discussed:  Infection, poor wound healing and poor cosmetic result   Alternatives discussed:  No treatment Universal protocol:    Procedure explained and questions answered to patient or proxy's satisfaction: yes     Relevant documents present and verified: yes     Test results available: yes     Patient identity confirmed:  Arm  band Anesthesia:    Anesthesia method:  None Laceration details:    Location:  Hand   Hand location:  L palm   Length (cm):  1 Exploration:    Imaging obtained: x-ray     Imaging outcome: foreign body not noted     Wound extent: no signs of injury, no underlying fracture and no vascular damage   Treatment:    Area cleansed with:  Saline and povidone-iodine   Amount of cleaning:  Standard   Irrigation solution:  Sterile saline   Irrigation volume:  250   Irrigation method:  Tap Skin repair:    Repair method:  Tissue adhesive Approximation:    Approximation:  Close Repair type:    Repair type:  Simple Post-procedure details:     Dressing:  Non-adherent dressing   {Document cardiac monitor, telemetry assessment procedure when appropriate:1}  Medications Ordered in ED Medications  Tdap (BOOSTRIX) injection 0.5 mL (has no administration in time range)  lidocaine (PF) (XYLOCAINE) 1 % injection 5 mL (has no administration in time range)    ED Course/ Medical Decision Making/ A&P Clinical Course as of 08/09/22 0016  Sun Aug 08, 2022  2345 DG Hand Complete Left [AA]    Clinical Course User Index [AA] Marita Kansas, PA-C                           Medical Decision Making Amount and/or Complexity of Data Reviewed Radiology:  Decision-making details documented in ED Course.  Risk Prescription drug management.   41 year old female presents today for evaluation of laceration to left thenar aspect.  1 cm length.  Fairly superficial.  Without signs of infection.  Occurred about 6 PM today.  Discussed suture repair versus Dermabond.  Patient states she uses her hands a lot and is concerned dermabond will not hold. prefers sutures.  Patient later changes her mind and prefers to have Dermabond.  Will prescribe Keflex to prevent infection.  Signs and symptoms of infection discussed.  Return precautions discussed.  Patient voices understanding and is in agreement with plan.   Final Clinical Impression(s) / ED Diagnoses Final diagnoses:  Laceration of left hand without foreign body, initial encounter    Rx / DC Orders ED Discharge Orders     None

## 2022-08-09 NOTE — Discharge Instructions (Signed)
Your wound was repaired with Dermabond.  I have attached information regarding wound care when Dermabond is used.  For the signs or symptoms of infection such as fever, significant swelling with drainage, significant redness please return for evaluation.

## 2022-11-16 DIAGNOSIS — G4489 Other headache syndrome: Secondary | ICD-10-CM | POA: Diagnosis not present

## 2022-11-16 DIAGNOSIS — J019 Acute sinusitis, unspecified: Secondary | ICD-10-CM | POA: Diagnosis not present

## 2023-01-30 DIAGNOSIS — J3089 Other allergic rhinitis: Secondary | ICD-10-CM | POA: Diagnosis not present

## 2023-01-30 DIAGNOSIS — J029 Acute pharyngitis, unspecified: Secondary | ICD-10-CM | POA: Diagnosis not present

## 2023-01-30 DIAGNOSIS — J019 Acute sinusitis, unspecified: Secondary | ICD-10-CM | POA: Diagnosis not present

## 2023-01-30 DIAGNOSIS — R0981 Nasal congestion: Secondary | ICD-10-CM | POA: Diagnosis not present

## 2023-03-18 DIAGNOSIS — R519 Headache, unspecified: Secondary | ICD-10-CM | POA: Diagnosis not present

## 2023-03-18 DIAGNOSIS — M542 Cervicalgia: Secondary | ICD-10-CM | POA: Diagnosis not present

## 2023-03-18 DIAGNOSIS — R051 Acute cough: Secondary | ICD-10-CM | POA: Diagnosis not present

## 2023-04-29 DIAGNOSIS — K5900 Constipation, unspecified: Secondary | ICD-10-CM | POA: Diagnosis not present

## 2023-04-29 DIAGNOSIS — M545 Low back pain, unspecified: Secondary | ICD-10-CM | POA: Diagnosis not present

## 2023-04-29 DIAGNOSIS — M549 Dorsalgia, unspecified: Secondary | ICD-10-CM | POA: Diagnosis not present

## 2023-05-15 DIAGNOSIS — J209 Acute bronchitis, unspecified: Secondary | ICD-10-CM | POA: Diagnosis not present

## 2023-05-15 DIAGNOSIS — R051 Acute cough: Secondary | ICD-10-CM | POA: Diagnosis not present

## 2023-06-01 DIAGNOSIS — R0981 Nasal congestion: Secondary | ICD-10-CM | POA: Diagnosis not present

## 2023-06-01 DIAGNOSIS — J029 Acute pharyngitis, unspecified: Secondary | ICD-10-CM | POA: Diagnosis not present

## 2023-07-24 DIAGNOSIS — J039 Acute tonsillitis, unspecified: Secondary | ICD-10-CM | POA: Diagnosis not present

## 2023-07-24 DIAGNOSIS — R509 Fever, unspecified: Secondary | ICD-10-CM | POA: Diagnosis not present

## 2023-07-24 DIAGNOSIS — R519 Headache, unspecified: Secondary | ICD-10-CM | POA: Diagnosis not present

## 2023-07-24 DIAGNOSIS — R070 Pain in throat: Secondary | ICD-10-CM | POA: Diagnosis not present

## 2023-08-09 DIAGNOSIS — R519 Headache, unspecified: Secondary | ICD-10-CM | POA: Diagnosis not present

## 2023-08-09 DIAGNOSIS — R0981 Nasal congestion: Secondary | ICD-10-CM | POA: Diagnosis not present

## 2023-08-09 DIAGNOSIS — J3489 Other specified disorders of nose and nasal sinuses: Secondary | ICD-10-CM | POA: Diagnosis not present

## 2023-08-10 DIAGNOSIS — J019 Acute sinusitis, unspecified: Secondary | ICD-10-CM | POA: Diagnosis not present

## 2023-08-10 DIAGNOSIS — J3489 Other specified disorders of nose and nasal sinuses: Secondary | ICD-10-CM | POA: Diagnosis not present

## 2023-09-06 DIAGNOSIS — R051 Acute cough: Secondary | ICD-10-CM | POA: Diagnosis not present

## 2023-09-06 DIAGNOSIS — R0981 Nasal congestion: Secondary | ICD-10-CM | POA: Diagnosis not present

## 2023-09-06 DIAGNOSIS — R042 Hemoptysis: Secondary | ICD-10-CM | POA: Diagnosis not present

## 2023-09-21 DIAGNOSIS — R6883 Chills (without fever): Secondary | ICD-10-CM | POA: Diagnosis not present

## 2023-09-21 DIAGNOSIS — R519 Headache, unspecified: Secondary | ICD-10-CM | POA: Diagnosis not present

## 2023-09-21 DIAGNOSIS — R42 Dizziness and giddiness: Secondary | ICD-10-CM | POA: Diagnosis not present

## 2023-09-21 DIAGNOSIS — R0981 Nasal congestion: Secondary | ICD-10-CM | POA: Diagnosis not present

## 2023-09-21 NOTE — Progress Notes (Signed)
New patient visit   Patient: Dawn Herman   DOB: December 08, 1980   43 y.o. Female  MRN: [id number] Visit Date: 09/22/2023  Today's healthcare provider: Alfredia Ferguson, PA-C   Cc. New patient, chronic sinusitis  Subjective    Dawn Herman is a 43 y.o. female who presents today as a new patient to establish care.   Discussed the use of AI scribe software for clinical note transcription with the patient, who gave verbal consent to proceed.  History of Present Illness   Dawn Herman, a long-term smoker, presents with chronic sinus issues that have persisted for over a year and a half. She has sought treatment at urgent care multiple times, where she has been prescribed various medications, including antibiotics and steroids. However, these treatments have not resolved her symptoms. She thinks she has had over 10 rounds of antibiotic treatment this year. Dawn Herman describes her symptoms as pressure in her head, pain in her ears and neck, and a sensation of her brain about to explode. She also notes a correlation between these symptoms and gas in her stomach. After eating, she experiences a cough and produces mucus in her throat. She reports history of chronic migraines, and reports these symptoms are different. Denies vision changes.   She cannot take otc antihistamines-- reports they all trigger migraines. Reports relief with otc decongestants, including Norel-- which has chlorpheniramine that does not cause migraines. She has started flonase recently.   She also reports a frequent racing heartbeat-- especially when she goes from lying to sitting, and when she feels unwell.  She reports labwork a few months ago, that showed her sugar was high.      Past Medical History:  Diagnosis Date   BV (bacterial vaginosis)    recurrent   Hx of migraines    History reviewed. No pertinent surgical history. Family Status  Relation Name Status   Mother  Alive   Father  Deceased   Sister   Alive   Brother  Alive   MGM  Deceased   MGF  Deceased   PGM  Deceased   PGF  Deceased  No partnership data on file   Family History  Problem Relation Age of Onset   Hypertension Mother    Social History   Socioeconomic History   Marital status: Married    Spouse name: Not on file   Number of children: Not on file   Years of education: Not on file   Highest education level: Not on file  Occupational History   Not on file  Tobacco Use   Smoking status: Every Day    Current packs/day: 0.25    Average packs/day: 0.3 packs/day for 36.0 years (9.0 ttl pk-yrs)    Types: Cigarettes    Start date: 1989   Smokeless tobacco: Never   Tobacco comments:    15 cigarettes a day  Substance and Sexual Activity   Alcohol use: Yes    Comment: social   Drug use: Yes    Types: Marijuana   Sexual activity: Yes    Partners: Male    Birth control/protection: None  Other Topics Concern   Not on file  Social History Narrative   Not on file   Social Drivers of Health   Financial Resource Strain: Not on file  Food Insecurity: Not on file  Transportation Needs: Not on file  Physical Activity: Not on file  Stress: Not on file  Social Connections: Not on file   Outpatient Medications Prior to  Visit  Medication Sig   amoxicillin-clavulanate (AUGMENTIN) 875-125 MG tablet Take 1 tablet by mouth 2 (two) times daily.   azithromycin (ZITHROMAX) 250 MG tablet Take by mouth daily.   [DISCONTINUED] amoxicillin-clavulanate (AUGMENTIN) 875-125 MG tablet Take 1 tablet by mouth 2 (two) times daily.   azelastine (ASTELIN) 0.1 % nasal spray Place 2 sprays into both nostrils 2 (two) times daily.   scopolamine (TRANSDERM-SCOP) 1 MG/3DAYS Place 1 patch onto the skin every 3 (three) days.   [DISCONTINUED] azithromycin (ZITHROMAX) 250 MG tablet Take 250 mg by mouth as directed.   No facility-administered medications prior to visit.   Allergies  Allergen Reactions   Tylenol [Acetaminophen] Other (See  Comments)    migraines    Immunization History  Administered Date(s) Administered   Tdap 08/09/2022    Health Maintenance  Topic Date Due   Pneumococcal Vaccine 32-37 Years old (1 of 2 - PCV) Never done   HIV Screening  Never done   Hepatitis C Screening  Never done   Cervical Cancer Screening (HPV/Pap Cotest)  10/21/2015   COVID-19 Vaccine (1 - 2024-25 season) 10/08/2023 (Originally 05/08/2023)   INFLUENZA VACCINE  12/05/2023 (Originally 04/07/2023)   DTaP/Tdap/Td (2 - Td or Tdap) 08/09/2032   HPV VACCINES  Aged Out    Patient Care Team: Alfredia Ferguson, PA-C as PCP - General (Physician Assistant)  Review of Systems  Constitutional:  Negative for fatigue and fever.  HENT:  Positive for congestion, ear pain, sinus pressure and sinus pain.   Respiratory:  Negative for cough and shortness of breath.   Cardiovascular:  Negative for chest pain and leg swelling.  Gastrointestinal:  Negative for abdominal pain.  Neurological:  Positive for headaches. Negative for dizziness.        Objective    BP 110/69   Pulse 69   Temp 97.7 F (36.5 C) (Oral)   Ht 5\' 9"  (1.753 m)   Wt 140 lb 8 oz (63.7 kg)   SpO2 98%   BMI 20.75 kg/m     Physical Exam Constitutional:      General: She is awake.     Appearance: She is well-developed.  HENT:     Head: Normocephalic.     Ears:     Comments: B/l TM bulging with clear fluid    Nose:     Comments: Appears to have a deviated sinus, smaller cavity L nare Eyes:     Conjunctiva/sclera: Conjunctivae normal.  Cardiovascular:     Rate and Rhythm: Normal rate and regular rhythm.     Heart sounds: Normal heart sounds.  Pulmonary:     Effort: Pulmonary effort is normal.     Breath sounds: Normal breath sounds.  Skin:    General: Skin is warm.  Neurological:     Mental Status: She is alert and oriented to person, place, and time.  Psychiatric:        Attention and Perception: Attention normal.        Mood and Affect: Mood normal.         Speech: Speech normal.        Behavior: Behavior is cooperative.    Depression Screen    09/22/2023    3:24 PM  PHQ 2/9 Scores  PHQ - 2 Score 0   No results found for any visits on 09/22/23.  Assessment & Plan     Recurrent sinusitis Assessment & Plan: - Refer to ENT specialist for further evaluation - Advise use of saline sprays,  flonase. - Recommend trial of chlorpheniramine given tolerance to that compared to zyrtec, claritin etc   Orders: -     Ambulatory referral to ENT -     CBC with Differential/Platelet -     Comprehensive metabolic panel -     HIV Antibody (routine testing w rflx) -     Hepatitis C antibody -     Chlorpheniramine Maleate; Take 1 tablet (4 mg total) by mouth daily as needed for allergies. Dont take with other cough/cold medicines  Dispense: 30 tablet; Refill: 0  Racing heart beat Assessment & Plan: Check cbc, cmp, tsh/t4  Normal HR today Consider zio if persists  Orders: -     TSH -     T4, free  Hyperglycemia -     Hemoglobin A1c  Encounter for screening for HIV -     HIV Antibody (routine testing w rflx)  Encounter for hepatitis C screening test for low risk patient -     Hepatitis C antibody  Tobacco use Assessment & Plan: Recommend smoking cessation         Return if symptoms worsen or fail to improve.      Alfredia Ferguson, PA-C  [facility] [place] [phone] (phone) [phone] (fax)  [facility]

## 2023-09-22 ENCOUNTER — Encounter: Payer: Self-pay | Admitting: Physician Assistant

## 2023-09-22 ENCOUNTER — Ambulatory Visit (INDEPENDENT_AMBULATORY_CARE_PROVIDER_SITE_OTHER): Payer: BC Managed Care – PPO | Admitting: Physician Assistant

## 2023-09-22 VITALS — BP 110/69 | HR 69 | Temp 97.7°F | Ht 69.0 in | Wt 140.5 lb

## 2023-09-22 DIAGNOSIS — R000 Tachycardia, unspecified: Secondary | ICD-10-CM | POA: Diagnosis not present

## 2023-09-22 DIAGNOSIS — J329 Chronic sinusitis, unspecified: Secondary | ICD-10-CM | POA: Diagnosis not present

## 2023-09-22 DIAGNOSIS — Z1159 Encounter for screening for other viral diseases: Secondary | ICD-10-CM

## 2023-09-22 DIAGNOSIS — Z114 Encounter for screening for human immunodeficiency virus [HIV]: Secondary | ICD-10-CM

## 2023-09-22 DIAGNOSIS — R739 Hyperglycemia, unspecified: Secondary | ICD-10-CM | POA: Diagnosis not present

## 2023-09-22 DIAGNOSIS — Z720 Tobacco use: Secondary | ICD-10-CM | POA: Diagnosis not present

## 2023-09-22 MED ORDER — CHLORPHENIRAMINE MALEATE 4 MG PO TABS: 4.0000 mg | ORAL_TABLET | Freq: Every day | ORAL | 0 refills | Status: AC | PRN

## 2023-09-22 NOTE — Assessment & Plan Note (Addendum)
Check cbc, cmp, tsh/t4  Normal HR today Consider zio if persists

## 2023-09-22 NOTE — Assessment & Plan Note (Signed)
Recommend smoking cessation.

## 2023-09-22 NOTE — Assessment & Plan Note (Signed)
-   Refer to ENT specialist for further evaluation - Advise use of saline sprays, flonase. - Recommend trial of chlorpheniramine given tolerance to that compared to zyrtec, claritin etc

## 2023-09-23 ENCOUNTER — Encounter: Payer: Self-pay | Admitting: Physician Assistant

## 2023-09-23 DIAGNOSIS — R7303 Prediabetes: Secondary | ICD-10-CM | POA: Insufficient documentation

## 2023-09-23 LAB — COMPREHENSIVE METABOLIC PANEL
ALT: 12 U/L (ref 0–35)
AST: 18 U/L (ref 0–37)
Albumin: 4.3 g/dL (ref 3.5–5.2)
Alkaline Phosphatase: 88 U/L (ref 39–117)
BUN: 10 mg/dL (ref 6–23)
CO2: 26 meq/L (ref 19–32)
Calcium: 9.1 mg/dL (ref 8.4–10.5)
Chloride: 107 meq/L (ref 96–112)
Creatinine, Ser: 0.77 mg/dL (ref 0.40–1.20)
GFR: 94.04 mL/min (ref >60.00)
Glucose, Bld: 86 mg/dL (ref 70–99)
Potassium: 4.2 meq/L (ref 3.5–5.1)
Sodium: 141 meq/L (ref 135–145)
Total Bilirubin: 0.6 mg/dL (ref 0.2–1.2)
Total Protein: 7.2 g/dL (ref 6.0–8.3)

## 2023-09-23 LAB — CBC WITH DIFFERENTIAL/PLATELET
Basophils Absolute: 0.1 10*3/uL (ref 0.0–0.1)
Basophils Relative: 0.9 % (ref 0.0–3.0)
Eosinophils Absolute: 0.1 10*3/uL (ref 0.0–0.7)
Eosinophils Relative: 0.8 % (ref 0.0–5.0)
HCT: 43.1 % (ref 36.0–46.0)
Hemoglobin: 14.3 g/dL (ref 12.0–15.0)
Lymphocytes Relative: 40.5 % (ref 12.0–46.0)
Lymphs Abs: 2.5 10*3/uL (ref 0.7–4.0)
MCHC: 33.2 g/dL (ref 30.0–36.0)
MCV: 93.5 fL (ref 78.0–100.0)
Monocytes Absolute: 0.4 10*3/uL (ref 0.1–1.0)
Monocytes Relative: 7.1 % (ref 3.0–12.0)
Neutro Abs: 3.1 10*3/uL (ref 1.4–7.7)
Neutrophils Relative %: 50.7 % (ref 43.0–77.0)
Platelets: 187 10*3/uL (ref 150.0–400.0)
RBC: 4.61 Mil/uL (ref 3.87–5.11)
RDW: 13 % (ref 11.5–15.5)
WBC: 6.1 10*3/uL (ref 4.0–10.5)

## 2023-09-23 LAB — HEMOGLOBIN A1C: Hgb A1c MFr Bld: 5.7 % (ref 4.6–6.5)

## 2023-09-23 LAB — HIV ANTIBODY (ROUTINE TESTING W REFLEX): HIV 1&2 Ab, 4th Generation: NONREACTIVE

## 2023-09-23 LAB — T4, FREE: Free T4: 0.94 ng/dL (ref 0.60–1.60)

## 2023-09-23 LAB — TSH: TSH: 1.34 u[IU]/mL (ref 0.35–5.50)

## 2023-09-23 LAB — HEPATITIS C ANTIBODY: Hepatitis C Ab: NONREACTIVE

## 2023-11-15 ENCOUNTER — Ambulatory Visit (INDEPENDENT_AMBULATORY_CARE_PROVIDER_SITE_OTHER): Payer: BC Managed Care – PPO | Admitting: Otolaryngology

## 2023-11-15 ENCOUNTER — Encounter (INDEPENDENT_AMBULATORY_CARE_PROVIDER_SITE_OTHER): Payer: Self-pay

## 2023-11-15 VITALS — BP 113/74 | HR 78 | Ht 70.0 in | Wt 140.0 lb

## 2023-11-15 DIAGNOSIS — R0981 Nasal congestion: Secondary | ICD-10-CM | POA: Diagnosis not present

## 2023-11-15 DIAGNOSIS — J0181 Other acute recurrent sinusitis: Secondary | ICD-10-CM

## 2023-11-15 DIAGNOSIS — J342 Deviated nasal septum: Secondary | ICD-10-CM

## 2023-11-15 DIAGNOSIS — J343 Hypertrophy of nasal turbinates: Secondary | ICD-10-CM

## 2023-11-15 DIAGNOSIS — F17200 Nicotine dependence, unspecified, uncomplicated: Secondary | ICD-10-CM

## 2023-11-15 DIAGNOSIS — F17210 Nicotine dependence, cigarettes, uncomplicated: Secondary | ICD-10-CM

## 2023-11-15 MED ORDER — AZELASTINE HCL 0.1 % NA SOLN: 2.0000 | Freq: Every day | NASAL | 12 refills | Status: AC

## 2023-11-15 MED ORDER — FLUTICASONE PROPIONATE 50 MCG/ACT NA SUSP: 2.0000 | Freq: Every day | NASAL | 6 refills | Status: AC

## 2023-11-15 NOTE — Patient Instructions (Signed)
 I have ordered an imaging study for you to complete prior to your next visit. Please call [facility] Scheduling at [phone] to schedule your imaging if you have not received a call within 24 hours. If you are unable to complete your imaging study prior to your next scheduled visit please call our office to let us know.  3 days before the CT scan, stop using the flushes Flonase use two sprays daily in each nostril Astelin use two sprays daily in each nostril Lloyd Huger Med Nasal Saline Rinse -- use once a day; can buy it [facility] or [facility] - start nasal saline rinses with NeilMed Bottle available over the counter    Nasal Saline Irrigation instructions: If you choose to make your own salt water solution, You will need: Salt (kosher, canning, or pickling salt) Baking soda Nasal irrigation bottle (i.e. Lloyd Huger Med Sinus Rinse) Measuring spoon ( teaspoon) Distilled / boiled water Mix solution Mix 1 teaspoon of salt, 1/2 teaspoon of baking soda and 1 cup of water into irrigation bottle ** May use saline packet instead of homemade recipe for this step if you prefer If medicine was prescribed to be mixed with solution, place this into bottle Examples 2 inches of 2% mupirocin ointment Budesonide solution Position your head: Lean over sink (about 45 degrees) Rotate head (about 45 degrees) so that one nostril is above the other Irrigate Insert tip of irrigation bottle into upper nostril so it forms a comfortable seal Irrigate while breathing through your mouth May remove the straw from the bottle in order to irrigate the entire solution (important if medicine was added) Exhale through nose when finished and blow nose as necessary  Repeat on opposite side with other 1/2 of solution (120 mL) or remake solution if all 240 mL was used on first side Wash irrigation bottle regularly, replace every 3 months

## 2023-11-15 NOTE — Progress Notes (Signed)
 Dear Dr. Ok Edwards, Here is my assessment for our mutual patient, Dawn Herman. Thank you for allowing me the opportunity to care for your patient. Please do not hesitate to contact me should you have any other questions. Sincerely, Dr. Jovita Kussmaul  Otolaryngology Clinic Note  HISTORY: Dawn Herman is a 43 y.o. female kindly referred by Dr. Ok Edwards for evaluation of recurrent sinusitis  Initial visit (11/2023): She reports that she has recurrent sinus infections signified by bilateral facial pressure and head (maxillary, frontal), and ear pressure as well. Maybe some vision blurriness if things get worse. During episodes, she does report congestion, discolored drainage bilateral (green), but denies PND. No hyposmia. She does report that feeling bloated makes things worse especially with headache and some vision change as well. Between episodes she reports that most of the symptoms resolve except perhaps some head pressure. Antibiotics help with symptoms, but steroids do not help (she thinks maybe even make things worse - had to call ambulance in Dec because of palpitations). She reports that she has had about 11 episodes of this last year, last episode was in December and since then has been feeling better after abx. Got antibiotics for this at least 4 times, most recently in Late December. Current symptoms are minimal and include some temporal headache.   Has had migraines but diet change resolved them.   Has tried Flonase, but only for 3 days. Not tried any other sprays. No allergy medication currently - when she uses them, she has mood changes. No rinses prior. Allergy testing has not been done.  No CT. No previous sinonasal surgery.  She is currently using no nasal medications.  GLP-1: no AP/AC: no  Tobacco: current smoker, 0.25 PPD Lives in [place], [place]  PMHx: Migraines, Palpitations Works in Engineering geologist  RADIOGRAPHIC EVALUATION AND INDEPENDENT REVIEW OF OTHER RECORDS: Alfredia Ferguson  PCP (09/22/2023): chronic sinus issues, multiple UC visits, including abx/steroids; over 10 rounds of abx treatment; pressure in head, ear pain; migraines but these sx are different; relief with decongestant; Dx: Recurrent sinusitis; Rx: Flonase, chlorpheniramine, ref to ENT Unable to view UC notes Labs 09/22/2023: TSH/T4: wnl; CMP and CBC: liver markers and kidney markers not elevated; WBC 6.1, Hgb 14, Eos 100  Past Medical History:  Diagnosis Date   BV (bacterial vaginosis)    recurrent   Hx of migraines    History reviewed. No pertinent surgical history. Family History  Problem Relation Age of Onset   Hypertension Mother    Social History   Tobacco Use   Smoking status: Every Day    Current packs/day: 0.25    Average packs/day: 0.3 packs/day for 36.2 years (9.0 ttl pk-yrs)    Types: Cigarettes    Start date: 1989   Smokeless tobacco: Never   Tobacco comments:    15 cigarettes a day  Substance Use Topics   Alcohol use: Yes    Comment: social   Allergies  Allergen Reactions   Tylenol [Acetaminophen] Other (See Comments)    migraines   Prednisone Palpitations   Current Outpatient Medications  Medication Sig Dispense Refill   azelastine (ASTELIN) 0.1 % nasal spray Place 2 sprays into both nostrils daily. Use in each nostril as directed 30 mL 12   fluticasone (FLONASE) 50 MCG/ACT nasal spray Place 2 sprays into both nostrils daily. 16 g 6   amoxicillin-clavulanate (AUGMENTIN) 875-125 MG tablet Take 1 tablet by mouth 2 (two) times daily. (Patient not taking: Reported on 11/15/2023)     azithromycin [facility])  250 MG tablet Take by mouth daily. (Patient not taking: Reported on 11/15/2023)     chlorpheniramine (CHLOR-TRIMETON) 4 MG tablet Take 1 tablet (4 mg total) by mouth daily as needed for allergies. Dont take with other cough/cold medicines (Patient not taking: Reported on 11/15/2023) 30 tablet 0   scopolamine (TRANSDERM-SCOP) 1 MG/3DAYS Place 1 patch onto the skin every 3  (three) days. (Patient not taking: Reported on 11/15/2023)     No current facility-administered medications for this visit.   BP 113/74 (BP Location: Left Arm, Patient Position: Sitting, Cuff Size: Normal)   Pulse 78   Ht 5\' 10"  (1.778 m)   Wt 140 lb (63.5 kg)   SpO2 97%   BMI 20.09 kg/m   PHYSICAL EXAM:  BP 113/74 (BP Location: Left Arm, Patient Position: Sitting, Cuff Size: Normal)   Pulse 78   Ht 5\' 10"  (1.778 m)   Wt 140 lb (63.5 kg)   SpO2 97%   BMI 20.09 kg/m    Salient findings:  CN II-XII intact  Bilateral EAC clear and TM intact with well pneumatized middle ear spaces Nose: Anterior rhinoscopy reveals nasal septal deviation left and right > left ITH  Nasal endoscopy was indicated to better evaluate the nose and paranasal sinuses, given the patient's history and exam findings, and is detailed below. No lesions of oral cavity/oropharynx No obviously palpable neck masses/lymphadenopathy/thyromegaly No respiratory distress or stridor   PROCEDURE: Diagnostic Nasal Endoscopy Pre-procedure diagnosis: Concern for chronic sinusitis; headache Post-procedure diagnosis: same Indication: See pre-procedure diagnosis and physical exam above Complications: None apparent EBL: 0 mL Anesthesia: Lidocaine 4% and topical decongestant was topically sprayed in each nasal cavity  Description of Procedure:  Patient was identified. A rigid 30 degree endoscope was utilized to evaluate the sinonasal cavities, mucosa, sinus ostia and turbinates and septum.  Overall, signs of minimal mucosal edema globally are noted.  Also noted are left septal deviation.  No mucopurulence, polyps, or masses noted.   Right Middle meatus: clear Right SE Recess: clear Left MM: some mucoid secretions Left SE Recess: clear  Photodocumentation was obtained.  CPT CODE -- 31231 - Mod 25   ASSESSMENT:  43 y.o. with Migraines now with:  1. 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 Nasal septal deviation   4. Nasal congestion   5. Tobacco use disorder    PLAN: We've discussed issues and options today. Some of her symptoms are consistent with recurrent sinusitis, but headache may not be consistent since it occurs despite sinus symptoms. Given response to abx, however, perhaps there is a sinonasal component. She does think this is distinct from her migraines. We reviewed the nasal endoscopy images together.  The risks, benefits and alternatives were discussed and questions answered.  She has elected to proceed with medical management and will get a post-treatment CT.   Do not feel strongly re: abx given improvement  Given the patient's tobacco use, I also discussed cessation and options for cessation, including counseling. Counseled patient on the dangers of tobacco use, advised patient to stop smoking, and reviewed strategies to maximize success. Patient is not ready to quit, and declined further treatment. Total time spent with this was 4 minutes.   1) Start flonase daily 2) Start astelin daily 3) Daily sinus rinses 4) CT Sinus w/o contrast - 6 weeks F/u 6 weeks, sooner if needed  See below regarding exact medications prescribed this encounter including dosages and route: Meds  ordered this encounter  Medications   fluticasone (FLONASE) 50 MCG/ACT nasal spray    Sig: Place 2 sprays into both nostrils daily.    Dispense:  16 g    Refill:  6   azelastine (ASTELIN) 0.1 % nasal spray    Sig: Place 2 sprays into both nostrils daily. Use in each nostril as directed    Dispense:  30 mL    Refill:  12     Thank you for allowing me the opportunity to care for your patient. Please do not hesitate to contact me should you have any other questions.  Sincerely, Jovita Kussmaul, MD Otolaryngologist (ENT), [facility] Phone: [phone] Fax: [phone]  MDM:  Level 4: [phone] Complexity/Problems addressed: mod Data complexity: mod -  independent interpretation of notes, labs, ordering tests - Morbidity: mod  - Prescription Drug prescribed or managed: yes  11/15/2023, 12:43 PM

## 2023-12-05 ENCOUNTER — Telehealth (INDEPENDENT_AMBULATORY_CARE_PROVIDER_SITE_OTHER): Payer: Self-pay | Admitting: Otolaryngology

## 2023-12-05 NOTE — Telephone Encounter (Signed)
 Informed patient CT Scan was denied by [facility]. Per Dr. Allena Katz - will follow up on 4/29 and reevaluate for scan. Patient aware and will call with any questions.

## 2023-12-08 ENCOUNTER — Ambulatory Visit (HOSPITAL_BASED_OUTPATIENT_CLINIC_OR_DEPARTMENT_OTHER)

## 2024-01-03 ENCOUNTER — Ambulatory Visit (INDEPENDENT_AMBULATORY_CARE_PROVIDER_SITE_OTHER): Admitting: Otolaryngology

## 2024-01-03 ENCOUNTER — Encounter (INDEPENDENT_AMBULATORY_CARE_PROVIDER_SITE_OTHER): Payer: Self-pay

## 2024-01-03 VITALS — BP 116/70 | HR 81 | Ht 70.0 in | Wt 135.0 lb

## 2024-01-03 DIAGNOSIS — J342 Deviated nasal septum: Secondary | ICD-10-CM

## 2024-01-03 DIAGNOSIS — J0181 Other acute recurrent sinusitis: Secondary | ICD-10-CM

## 2024-01-03 DIAGNOSIS — J343 Hypertrophy of nasal turbinates: Secondary | ICD-10-CM | POA: Diagnosis not present

## 2024-01-03 DIAGNOSIS — R0981 Nasal congestion: Secondary | ICD-10-CM | POA: Diagnosis not present

## 2024-01-03 NOTE — Patient Instructions (Signed)
 Use flonase  two sprays each nostril two times per day; right after, use astelin  two sprays each nostril two times per day  Andres Bangs Med Nasal Saline Rinse - use them daily - start nasal saline rinses with NeilMed Bottle available over the counter    Nasal Saline Irrigation instructions: If you choose to make your own salt water solution, You will need: Salt (kosher, canning, or pickling salt) Baking soda Nasal irrigation bottle (i.e. Andres Bangs Med Sinus Rinse) Measuring spoon ( teaspoon) Distilled / boiled water   Mix solution Mix 1 teaspoon of salt, 1/2 teaspoon of baking soda and 1 cup of water into irrigation bottle ** May use saline packet instead of homemade recipe for this step if you prefer If medicine was prescribed to be mixed with solution, place this into bottle Examples 2 inches of 2% mupirocin ointment Budesonide solution Position your head: Lean over sink (about 45 degrees) Rotate head (about 45 degrees) so that one nostril is above the other Irrigate Insert tip of irrigation bottle into upper nostril so it forms a comfortable seal Irrigate while breathing through your mouth May remove the straw from the bottle in order to irrigate the entire solution (important if medicine was added) Exhale through nose when finished and blow nose as necessary  Repeat on opposite side with other 1/2 of solution (120 mL) or remake solution if all 240 mL was used on first side Wash irrigation bottle regularly, replace every 3 months

## 2024-01-03 NOTE — Progress Notes (Signed)
 Dear Dr. Donnia Galas, Here is my assessment for our mutual patient, Dawn Herman. Thank you for allowing me the opportunity to care for your patient. Please do not hesitate to contact me should you have any other questions. Sincerely, Dr. Milon Aloe  Otolaryngology Clinic Note  HISTORY: Dawn Herman is a 43 y.o. female kindly referred by Dr. Donnia Galas for evaluation of recurrent sinusitis  Initial visit (11/2023): She reports that she has recurrent sinus infections signified by bilateral facial pressure and head (maxillary, frontal), and ear pressure as well. Maybe some vision blurriness if things get worse. During episodes, she does report congestion, discolored drainage bilateral (green), but denies PND. No hyposmia. She does report that feeling bloated makes things worse especially with headache and some vision change as well. Between episodes she reports that most of the symptoms resolve except perhaps some head pressure. Antibiotics help with symptoms, but steroids do not help (she thinks maybe even make things worse - had to call ambulance in Dec because of palpitations). She reports that she has had about 11 episodes of this last year, last episode was in December and since then has been feeling better after abx. Got antibiotics for this at least 4 times, most recently in Late December. Current symptoms are minimal and include some temporal headache.   Has had migraines but diet change resolved them.   Has tried Flonase , but only for 3 days. Not tried any other sprays. No allergy medication currently - when she uses them, she has mood changes. No rinses prior. Allergy testing has not been done.  No CT. No previous sinonasal surgery.  She is currently using no nasal medications.  --------------------------------------------------------- 01/03/2024 CT was denied by insurance. She reports that she continues to have bilateral facial pressure and ear pressure. Still having congestion, no  discolored drainage currently. No PND. Some persistent vision changes as well accompanying her headaches. She has had another urgent care visit and has had augmentin  with some improvement. She has not used the astelin  or flonase  except for twice. Did not use nasal rinses.  ----------------------------------------------------------- GLP-1: no AP/AC: no  Tobacco: current smoker, 0.25 PPD Lives in [place], [place]  PMHx: Migraines, Palpitations Works in [employer] ge[employer]  RADIOGRAPHIC EVALUATION AND INDEPENDENT REVIEW OF OTHER RECORDS: Trenton Frock PCP (09/22/2023): chronic sinus issues, multiple UC visits, including abx/steroids; over 10 rounds of abx treatment; pressure in head, ear pain; migraines but these sx are different; relief with decongestant; Dx: Recurrent sinusitis; Rx: Flonase , chlorpheniramine , ref to ENT Unable to view UC notes Labs 09/22/2023: TSH/T4: wnl; CMP and CBC: liver markers and kidney markers not elevated; WBC 6.1, Hgb 14, Eos 100  Past Medical History:  Diagnosis Date   BV (bacterial vaginosis)    recurrent   Hx of migraines    History reviewed. No pertinent surgical history. Family History  Problem Relation Age of Onset   Hypertension Mother    Social History   Tobacco Use   Smoking status: Every Day    Current packs/day: 0.25    Average packs/day: 0.3 packs/day for 36.3 years (9.1 ttl pk-yrs)    Types: Cigarettes    Start date: 1989   Smokeless tobacco: Never   Tobacco comments:    15 cigarettes a day  Substance Use Topics   Alcohol use: Yes    Comment: social   Allergies  Allergen Reactions   Tylenol [Acetaminophen] Other (See Comments)    migraines   Prednisone Palpitations   Current Outpatient Medications  Medication Sig Dispense Refill  amoxicillin -clavulanate (AUGMENTIN ) 875-125 MG tablet Take 1 tablet by mouth 2 (two) times daily.     azithromycin (ZITHROMAX) 250 MG tablet Take by mouth daily.     chlorpheniramine  (CHLOR-TRIMETON ) 4 MG tablet Take 1  tablet (4 mg total) by mouth daily as needed for allergies. Dont take with other cough/cold medicines 30 tablet 0   scopolamine (TRANSDERM-SCOP) 1 MG/3DAYS Place 1 patch onto the skin every 3 (three) days.     azelastine  (ASTELIN ) 0.1 % nasal spray Place 2 sprays into both nostrils daily. Use in each nostril as directed 30 mL 12   fluticasone  (FLONASE ) 50 MCG/ACT nasal spray Place 2 sprays into both nostrils daily. 16 g 6   No current facility-administered medications for this visit.   BP 116/70 (BP Location: Left Arm, Patient Position: Sitting, Cuff Size: Normal)   Pulse 81   Ht 5\' 10"  (1.778 m)   Wt 135 lb (61.2 kg)   SpO2 94%   BMI 19.37 kg/m   PHYSICAL EXAM:  BP 116/70 (BP Location: Left Arm, Patient Position: Sitting, Cuff Size: Normal)   Pulse 81   Ht 5\' 10"  (1.778 m)   Wt 135 lb (61.2 kg)   SpO2 94%   BMI 19.37 kg/m    Salient findings:  CN II-XII intact  Bilateral EAC clear and TM intact with well pneumatized middle ear spaces Nose: Anterior rhinoscopy reveals nasal septal deviation left; right > left ITH  Nasal endoscopy was indicated to better evaluate the nose and paranasal sinuses, given the patient's history and exam findings including persistent symptoms, and is detailed below. No lesions of oral cavity/oropharynx No obviously palpable neck masses/lymphadenopathy/thyromegaly No respiratory distress or stridor   PROCEDURE: Diagnostic Nasal Endoscopy Pre-procedure diagnosis: Concern for chronic sinusitis; headache Post-procedure diagnosis: same Indication: See pre-procedure diagnosis and physical exam above Complications: None apparent EBL: 0 mL Anesthesia: Lidocaine  4% and topical decongestant was topically sprayed in each nasal cavity  Description of Procedure:  Patient was identified. A rigid 30 degree endoscope was utilized to evaluate the sinonasal cavities, mucosa, sinus ostia and turbinates and septum.  Overall, signs of minimal mucosal edema globally are  noted but mild.  Also noted are left septal deviation.  No mucopurulence, polyps, or masses noted.   Right Middle meatus: clear Right SE Recess: clear Left MM: some mucoid secretions Left SE Recess: clear  CPT CODE -- 31231 - Mod 25   ASSESSMENT:  43 y.o. with Migraines now with:  1. Other acute recurrent sinusitis   2. Hypertrophy of both inferior nasal turbinates   3. Nasal septal deviation   4. Nasal congestion    She did not use the prescribed nasal sprays but twice and no use of sinus rinses. CT was denied. Endo again overall reassuring without purulence. We've again discussed issues and options today. Some of her symptoms are consistent with recurrent sinusitis, but headache may not be consistent since it occurs despite sinus symptoms. Given response to abx, however, perhaps there is a sinonasal component. She does think this is distinct from her migraines.  The risks, benefits and alternatives were discussed and questions answered.  She has elected to proceed with medical management, which I explained would be necessary to trial sprays prior to insurance approving CT scan (unfortunately). She understands this  1) Start flonase  BID 2) Start astelin  BID 3) Daily sinus rinses F/u 3 months or sooner as necessary- if still persistent sx, will order CT as she would have failed medical management  See  below regarding exact medications prescribed this encounter including dosages and route: No orders of the defined types were placed in this encounter.  Thank you for allowing me the opportunity to care for your patient. Please do not hesitate to contact me should you have any other questions.  Sincerely, Milon Aloe, MD Otolaryngologist (ENT), [facility] Phone: [phone] Fax: [phone]  I have personally spent 33 minutes involved in face-to-face and non-face-to-face activities for this patient on the day of the visit.  Professional time spent excludes any  procedures performed but includes the following activities, in addition to those noted in the documentation: preparing to see the patient (review of outside documentation and results), performing a medically appropriate examination, extensive counseling, documenting in the electronic health record   01/03/2024, 7:16 PM

## 2024-01-16 ENCOUNTER — Telehealth (INDEPENDENT_AMBULATORY_CARE_PROVIDER_SITE_OTHER): Payer: Self-pay | Admitting: Otolaryngology

## 2024-01-16 ENCOUNTER — Encounter (INDEPENDENT_AMBULATORY_CARE_PROVIDER_SITE_OTHER): Payer: Self-pay

## 2024-01-16 NOTE — Telephone Encounter (Signed)
 Called patient; LVM & sent MyChart msg to call our office to r/s appt due to Dr. Lydia Sams being out of office

## 2024-04-05 ENCOUNTER — Ambulatory Visit (INDEPENDENT_AMBULATORY_CARE_PROVIDER_SITE_OTHER): Admitting: Otolaryngology

## 2024-09-12 ENCOUNTER — Encounter: Admitting: Family Medicine
